# Patient Record
Sex: Male | Born: 1965 | Race: White | Hispanic: No | Marital: Married | Smoking: Current every day smoker
Health system: Southern US, Community
[De-identification: ages and names within clinical notes are randomized; demographics above are authoritative.]

## PROBLEM LIST (undated history)

## (undated) DIAGNOSIS — K859 Acute pancreatitis without necrosis or infection, unspecified: Secondary | ICD-10-CM

## (undated) DIAGNOSIS — I10 Essential (primary) hypertension: Secondary | ICD-10-CM

## (undated) HISTORY — PX: SHOULDER ARTHROSCOPY: SHX128

---

## 2002-03-15 ENCOUNTER — Encounter: Payer: Self-pay | Admitting: Emergency Medicine

## 2002-03-15 ENCOUNTER — Emergency Department (HOSPITAL_COMMUNITY): Admission: EM | Admit: 2002-03-15 | Discharge: 2002-03-15 | Payer: Self-pay | Admitting: Emergency Medicine

## 2005-09-02 ENCOUNTER — Encounter: Payer: Self-pay | Admitting: Orthopedic Surgery

## 2008-10-28 ENCOUNTER — Inpatient Hospital Stay (HOSPITAL_COMMUNITY): Admission: EM | Admit: 2008-10-28 | Discharge: 2008-10-30 | Payer: Self-pay | Admitting: Emergency Medicine

## 2008-10-28 ENCOUNTER — Encounter (INDEPENDENT_AMBULATORY_CARE_PROVIDER_SITE_OTHER): Payer: Self-pay | Admitting: Internal Medicine

## 2010-10-16 LAB — URINALYSIS, ROUTINE W REFLEX MICROSCOPIC
Nitrite: NEGATIVE
Protein, ur: >300 mg/dL — AB
Specific Gravity, Urine: 1.016 (ref 1.005–1.030)
Urobilinogen, UA: 1 mg/dL (ref 0.0–1.0)

## 2010-10-16 LAB — DIFFERENTIAL
Basophils Absolute: 0.1 10*3/uL (ref 0.0–0.1)
Basophils Absolute: 0.1 10*3/uL (ref 0.0–0.1)
Basophils Relative: 0 % (ref 0–1)
Basophils Relative: 0 % (ref 0–1)
Eosinophils Absolute: 0.1 10*3/uL (ref 0.0–0.7)
Eosinophils Absolute: 0.1 10*3/uL (ref 0.0–0.7)
Lymphocytes Relative: 32 % (ref 12–46)
Lymphocytes Relative: 54 % — ABNORMAL HIGH (ref 12–46)
Lymphs Abs: 4.5 10*3/uL — ABNORMAL HIGH (ref 0.7–4.0)
Monocytes Absolute: 1.1 10*3/uL — ABNORMAL HIGH (ref 0.1–1.0)
Monocytes Relative: 9 % (ref 3–12)
Neutro Abs: 8.7 10*3/uL — ABNORMAL HIGH (ref 1.7–7.7)
Neutrophils Relative %: 34 % — ABNORMAL LOW (ref 43–77)
Neutrophils Relative %: 51 % (ref 43–77)
Neutrophils Relative %: 60 % (ref 43–77)

## 2010-10-16 LAB — CBC
HCT: 47.3 % (ref 39.0–52.0)
Hemoglobin: 13.3 g/dL (ref 13.0–17.0)
Hemoglobin: 16.3 g/dL (ref 13.0–17.0)
MCHC: 34.7 g/dL (ref 30.0–36.0)
MCV: 88.9 fL (ref 78.0–100.0)
MCV: 89 fL (ref 78.0–100.0)
Platelets: 240 10*3/uL (ref 150–400)
Platelets: 310 10*3/uL (ref 150–400)
RBC: 4.31 MIL/uL (ref 4.22–5.81)
WBC: 14.5 10*3/uL — ABNORMAL HIGH (ref 4.0–10.5)
WBC: 8.3 10*3/uL (ref 4.0–10.5)

## 2010-10-16 LAB — URINE CULTURE
Colony Count: NO GROWTH
Culture: NO GROWTH
Special Requests: POSITIVE

## 2010-10-16 LAB — RAPID URINE DRUG SCREEN, HOSP PERFORMED
Amphetamines: POSITIVE — AB
Cocaine: NOT DETECTED
Opiates: NOT DETECTED
Tetrahydrocannabinol: POSITIVE — AB

## 2010-10-16 LAB — BASIC METABOLIC PANEL
BUN: 25 mg/dL — ABNORMAL HIGH (ref 6–23)
BUN: 46 mg/dL — ABNORMAL HIGH (ref 6–23)
CO2: 26 mEq/L (ref 19–32)
Calcium: 8.5 mg/dL (ref 8.4–10.5)
Calcium: 9.3 mg/dL (ref 8.4–10.5)
Creatinine, Ser: 1.89 mg/dL — ABNORMAL HIGH (ref 0.4–1.5)
GFR calc Af Amer: >60 mL/min (ref >60)
GFR calc non Af Amer: 11 mL/min — ABNORMAL LOW (ref >60)
GFR calc non Af Amer: 40 mL/min — ABNORMAL LOW (ref >60)
Glucose, Bld: 130 mg/dL — ABNORMAL HIGH (ref 70–99)
Potassium: 3.5 mEq/L (ref 3.5–5.1)
Sodium: 137 mEq/L (ref 135–145)
Sodium: 140 mEq/L (ref 135–145)

## 2010-10-16 LAB — URINE MICROSCOPIC-ADD ON

## 2010-10-16 LAB — COMPREHENSIVE METABOLIC PANEL
Albumin: 4.1 g/dL (ref 3.5–5.2)
Alkaline Phosphatase: 112 U/L (ref 39–117)
BUN: 56 mg/dL — ABNORMAL HIGH (ref 6–23)
CO2: 25 mEq/L (ref 19–32)
Chloride: 94 mEq/L — ABNORMAL LOW (ref 96–112)
Creatinine, Ser: 9.43 mg/dL — ABNORMAL HIGH (ref 0.4–1.5)
GFR calc non Af Amer: 6 mL/min — ABNORMAL LOW (ref >60)
Glucose, Bld: 108 mg/dL — ABNORMAL HIGH (ref 70–99)
Total Bilirubin: 0.7 mg/dL (ref 0.3–1.2)

## 2010-10-16 LAB — CK: Total CK: 234 U/L — ABNORMAL HIGH (ref 7–232)

## 2010-10-16 LAB — ACETAMINOPHEN LEVEL: Acetaminophen (Tylenol), Serum: <10 ug/mL — ABNORMAL LOW (ref 10–30)

## 2010-10-16 LAB — HIV ANTIBODY (ROUTINE TESTING W REFLEX): HIV: NONREACTIVE

## 2010-10-16 LAB — SALICYLATE LEVEL: Salicylate Lvl: <4 mg/dL (ref 2.8–20.0)

## 2010-11-19 NOTE — Discharge Summary (Signed)
Gabriel Case, Gabriel Case             ACCOUNT NO.:  [id number]   MEDICAL RECORD NO.:  [id number]          PATIENT TYPE:  INP   LOCATION:  1338                         FACILITY:  [facility]   PHYSICIAN:  Gabriel Scott, MD     DATE OF BIRTH:  12/16/1965   DATE OF ADMISSION:  10/28/2008  DATE OF DISCHARGE:  10/30/2008                               DISCHARGE SUMMARY   PRIMARY MEDICAL DOCTOR:  Gentry Fitz.   DISCHARGE DIAGNOSES:  1. Acute renal failure, improved.  2. Acute gastroenteritis, resolved.  3. Normocytic anemia.  4. Polysubstance abuse.  5. Tobacco abuse.  6. Hypertension.  7. Leukocytosis, resolved.   DISCHARGE MEDICATIONS:  Carvedilol 12.5 mg p.o. 2 times daily.   DISCONTINUED MEDICATIONS:  Lisinopril  Hydrochlorothiazide.   PROCEDURES:  1. CT of the abdomen without contrast.  Impression:  Tiny left renal      calculus.  Question colonic wall thickening or splenic flexure      versus under-distension artifact.  Limited exam showing no definite      additional acute upper abdominal abnormalities.  2. CT pelvis without contrast.  Impression:  No acute intrapelvic      abnormalities.  3. Chest x-ray on April 24.  Impression:  Minimal streaky right      basilar atelectasis with no infiltrates, edema, or effusions.   PERTINENT LABS:  Urine culture is no growth.  Basic metabolic panel: BUN  20, creatinine 1.54.  CBC with hemoglobin 11.7, hematocrit 34.2, white  blood cell count 8.3, platelets 240,000.  HIV antibody nonreactive.  GC  and chlamydia probe, negative.  Urine sodium 77.  Urine creatinine 90.3.  Serum Sodium at that time urine testing time was 137.  CK was 234.  Urinalysis had greater than 300 mg per dL protein, moderate leukocytes,  21-50 white blood cells per high power field, and many bacteria.  Salicylate level was less than 4.  Blood alcohol level less than 5.  Initial creatinine of 9.43.  Serum acetaminophen level less than 10.  Urine drug screen was positive for  amphetamines, benzodiazepines, and  tetrahydrocannabinol.   CONSULTATIONS:  None.   HOSPITAL COURSE AND PATIENT DISPOSITION:  Gabriel Case is a 45 year old  pleasant gentleman with a history of hypertension who, for 1 year  duration on above medications.  He presented with a 3 to 4 day history  of intractable nausea and vomiting, and 1 day history of diarrhea x1.  He denied abdominal pain.  There was no dysuria or rash.  He had no sick  contacts and had not eaten anything unusual.  His blood pressure in the  ED was 80/40.  He denied IV drug abuse.  He was found to be in acute  renal failure with creatinine of 9.43.  He was then admitted for further  evaluation and management.   PROBLEM LIST:  1. Acute renal failure.  Possibly from dehydration and acute tubular      necrosis versus acute interstitial nephritis.  The patient was      admitted to stepdown.  He was aggressively hydrated with IV fluids.  He was nonoliguric.  Quickly, his creatinine started improving.  He      became asymptomatic of his GI symptoms.  The patient was      transferred out to a regular medical bed.  His creatinine is much      improved now.  He is advised to hold his lisinopril      hydrochlorothiazide until he follows up with an M.D. with complete      renal panel in the next 3 to 5 days, and then further decision is      to be made.  He has also been advised to avoid NSAID medications.      He was empirically started on Cipro, but his urine cultures are      negative and, hence discontinued.  2. Acute gastroenteritis, which is probably viral, and there has been      an outbreak of gastroenteritis around.  This has resolved and the      patient is tolerating diet.  3. Normocytic anemia.  For outpatient evaluation as deemed necessary.  4. Polysubstance abuse.  Cessation counseling done.  5. Tobacco abuse.  Again, cessation counseling done, and the patient      declined a nicotine patch.  6.  Leukocytosis - resolved.   The patient does not have a primary medical doctor.  We will provide  contact details for a primary MD. I have advised to follow up with a  repeat renal panel in the next 3 to 5 days.   Time taken in coordinating this discharge is 32 minutes.      Gabriel Scott, MD  Electronically Signed     AH/MEDQ  D:  10/30/2008  T:  10/30/2008  Job:  [id number]   cc:   Gabriel Case, M.D.  Fax: [phone]

## 2010-11-19 NOTE — H&P (Signed)
Gabriel Case, Gabriel Case             ACCOUNT NO.:  [id number]   MEDICAL RECORD NO.:  [id number]          PATIENT TYPE:  INP   LOCATION:  1338                         FACILITY:  [facility]   PHYSICIAN:  Della Goo, M.D. DATE OF BIRTH:  1965/11/30   DATE OF ADMISSION:  10/28/2008  DATE OF DISCHARGE:                              HISTORY & PHYSICAL   PRIMARY CARE PHYSICIAN:  Unassigned.   CHIEF COMPLAINT:  Nausea and vomiting.   HISTORY OF PRESENT ILLNESS:  This is a 45 year old male who presents to  the emergency department with complaints of severe nausea and vomiting  over 3 days.  He reports having 27 episodes.  He states that he has had  weakness, fatigue and dizziness.  He denies having any hematemesis.  He  denies having any diarrhea and denies having constipation.  He does  report having a cough which has been productive of whitish mucous.  His  family called the ambulance secondary to the patient's continued  symptoms and worsening condition.  On their arrival he was found to have  a blood pressure of 80/40.  The patient was started on IV fluids in the  field.   PAST MEDICAL HISTORY:  Significant for hypertension.   MEDICATIONS:  Include lisinopril, hydrochlorothiazide, and carvedilol.   ALLERGIES:  To PENICILLIN which cause rash, hives and swelling.   PAST SURGICAL HISTORY:  History of a left shoulder surgery.   SOCIAL HISTORY:  The patient is married.  He is a smoker.  He smokes 1  pack of cigarettes daily.  He reports drinking occasional alcohol.  He  also reports using marijuana.   FAMILY HISTORY:  Positive for coronary artery disease in 2 maternal  uncles.  No history of hypertension, diabetes or cancer that he knows  of.   REVIEW OF SYSTEMS:  Pertinents are mentioned above.  All other organ  systems are negative.   PHYSICAL EXAMINATION:  CONSTITUTIONAL:  This is a 45 year old toxic-  appearing thin male in mild distress.  VITAL SIGNS:  Temperature 97.4, blood  pressure after fluids on arrival  were 117/74, heart rate 79, respirations 16, O2 sats were 100%.  HEENT:  Examination normocephalic, atraumatic.  SKIN:  Examination the patient has flushing/facial erythema.  Pupils are  equally round reactive to light.  Extraocular movements are intact.  Funduscopic benign.  There is no scleral icterus or conjunctival pallor.  Nares are patent bilaterally.  Oropharynx is clear.  NECK:  Supple full range of motion.  No thyromegaly, adenopathy, jugular  venous distention.  CARDIOVASCULAR:  Regular rate and rhythm.  No murmurs, gallops or rubs.  LUNGS:  Clear to auscultation bilaterally.  ABDOMEN:  Positive bowel sounds, soft, nontender, nondistended.  No  hepatosplenomegaly.  EXTREMITIES:  Without cyanosis, clubbing or edema.  NEUROLOGIC:  Generalized weakness.  The patient is alert and oriented  x3.  Cranial nerves are intact.  Motor and sensory function are also  intact.  There are no focal deficits on examination.   LABORATORY STUDIES:  White blood cell count 14.5, hemoglobin 16.3,  hematocrit 47.3, MCV 88.9, platelets 310,000, neutrophils 60%,  lymphocytes  32%.  Sodium 136, potassium 3.6, chloride 94, carbon dioxide  25, BUN 56, creatinine 9.43, glucose 108, albumin 4.1, AST 10.  Urinalysis reveals large hemoglobin, moderate leukocyte esterase, and  total protein greater than 300, total creatinine kinase 234.  Urine  microscopic reveals 21-50 white blood cells per high-power field, 7-10  red blood cells per high-power field and many bacteria.  Urine drug  screen positive for benzodiazepines, positive for amphetamines and  positive for marijuana.   ASSESSMENT:  49. A 45 year old male being admitted with acute renal failure.  2. Dehydration.  3. Viral gastroenteritis.  4. Urinary tract infection.  5. Polysubstance abuse.   PLAN:  The patient will be admitted to the step-down ICU area.  Fluid  resuscitation will continue.  The patient will be  placed on clear  liquids for now.  Antiemetics have been ordered as well.  The patient  will be placed on ciprofloxacin to cover his urinary tract infection.  A  urine C and S has also been ordered.  The patient's blood pressure  medications and diuretics and ACE inhibitor will be held for now.  DVT  and GI prophylaxis will be initiated.  Further workup will ensue and  further consultations will ensue pending results of the patient's  clinical course and results of his clinical studies.      Della Goo, M.D.  Electronically Signed     HJ/MEDQ  D:  10/29/2008  T:  10/29/2008  Job:  [id number]

## 2021-02-08 ENCOUNTER — Emergency Department (HOSPITAL_COMMUNITY): Payer: Self-pay

## 2021-02-08 ENCOUNTER — Other Ambulatory Visit: Payer: Self-pay

## 2021-02-08 ENCOUNTER — Emergency Department (HOSPITAL_COMMUNITY)
Admission: EM | Admit: 2021-02-08 | Discharge: 2021-02-09 | Disposition: A | Discharge status: Home / Self Care | Payer: Self-pay | Attending: Emergency Medicine | Admitting: Emergency Medicine

## 2021-02-08 DIAGNOSIS — I10 Essential (primary) hypertension: Secondary | ICD-10-CM | POA: Insufficient documentation

## 2021-02-08 DIAGNOSIS — Z5321 Procedure and treatment not carried out due to patient leaving prior to being seen by health care provider: Secondary | ICD-10-CM | POA: Insufficient documentation

## 2021-02-08 DIAGNOSIS — R1013 Epigastric pain: Secondary | ICD-10-CM | POA: Insufficient documentation

## 2021-02-08 LAB — CBC WITH DIFFERENTIAL/PLATELET
Abs Immature Granulocytes: 0.03 10*3/uL (ref 0.00–0.07)
Basophils Absolute: 0.1 10*3/uL (ref 0.0–0.1)
Basophils Relative: 1 %
Eosinophils Absolute: 0.3 10*3/uL (ref 0.0–0.5)
Eosinophils Relative: 3 %
HCT: 47.2 % (ref 39.0–52.0)
Hemoglobin: 15.8 g/dL (ref 13.0–17.0)
Immature Granulocytes: 0 %
Lymphocytes Relative: 34 %
Lymphs Abs: 3.5 10*3/uL (ref 0.7–4.0)
MCH: 30 pg (ref 26.0–34.0)
MCHC: 33.5 g/dL (ref 30.0–36.0)
MCV: 89.6 fL (ref 80.0–100.0)
Monocytes Absolute: 0.6 10*3/uL (ref 0.1–1.0)
Monocytes Relative: 6 %
Neutro Abs: 5.8 10*3/uL (ref 1.7–7.7)
Neutrophils Relative %: 56 %
Platelets: 325 10*3/uL (ref 150–400)
RBC: 5.27 MIL/uL (ref 4.22–5.81)
RDW: 13.4 % (ref 11.5–15.5)
WBC: 10.3 10*3/uL (ref 4.0–10.5)
nRBC: 0 % (ref 0.0–0.2)

## 2021-02-08 LAB — COMPREHENSIVE METABOLIC PANEL
ALT: 16 U/L (ref 0–44)
AST: 18 U/L (ref 15–41)
Albumin: 3.7 g/dL (ref 3.5–5.0)
Alkaline Phosphatase: 103 U/L (ref 38–126)
Anion gap: 6 (ref 5–15)
BUN: 29 mg/dL — ABNORMAL HIGH (ref 6–20)
CO2: 27 mmol/L (ref 22–32)
Calcium: 9.6 mg/dL (ref 8.9–10.3)
Chloride: 101 mmol/L (ref 98–111)
Creatinine, Ser: 1.25 mg/dL — ABNORMAL HIGH (ref 0.61–1.24)
GFR, Estimated: >60 mL/min (ref >60)
Glucose, Bld: 105 mg/dL — ABNORMAL HIGH (ref 70–99)
Potassium: 4.5 mmol/L (ref 3.5–5.1)
Sodium: 134 mmol/L — ABNORMAL LOW (ref 135–145)
Total Bilirubin: 0.4 mg/dL (ref 0.3–1.2)
Total Protein: 6.5 g/dL (ref 6.5–8.1)

## 2021-02-08 LAB — TROPONIN I (HIGH SENSITIVITY): Troponin I (High Sensitivity): 6 ng/L (ref <18)

## 2021-02-08 LAB — LIPASE, BLOOD: Lipase: 30 U/L (ref 11–51)

## 2021-02-08 MED ORDER — ACETAMINOPHEN 325 MG PO TABS
650.0000 mg | ORAL_TABLET | Freq: Once | ORAL | Status: AC
  Administered 2021-02-08: 650 mg via ORAL
  Filled 2021-02-08: qty 2

## 2021-02-08 NOTE — ED Provider Notes (Signed)
Emergency Medicine Provider Triage Evaluation Note  Gabriel Case , a 55 y.o. male  was evaluated in triage.  Pt complains of epigastric/chest pain.  Patient states that he has had this pain intermittently over the last 3 weeks.  Pain is worse today.  Patient states that epigastric pain will become worse and radiates throughout his chest and to his back.  Patient describes pain as sharp.  Patient endorses associated nausea and vomiting.  Patient states that he has vomited twice today.  No hematemesis or coffee-ground emesis.  Patient denies any associated diaphoresis or shortness of breath.  Patient reports previous history of hypertension however has not been on medication for multiple years.  Review of Systems  Positive: Epigastric pain, chest pain, nausea, vomiting Negative: Numbness, weakness, syncope, lightheadedness,   Physical Exam  BP (!) 191/108 (BP Location: Right Arm)   Pulse 66   Temp 97.8 F (36.6 C) (Oral)   Resp 20   SpO2 97%  Gen:   Awake, no distress   Resp:  Normal effort, lungs clear to auscultation bilaterally MSK:   Moves extremities without difficulty  Other:  +2 carotid and radial pulse bilaterally.  Abdomen soft, nondistended, nontender.  Medical Decision Making  Medically screening exam initiated at 8:21 PM.  Appropriate orders placed.  MATTY VANROEKEL was informed that the remainder of the evaluation will be completed by another provider, this initial triage assessment does not replace that evaluation, and the importance of remaining in the ED until their evaluation is complete.  The patient appears stable so that the remainder of the work up may be completed by another provider.      Haskel Schroeder, PA-C 02/08/21 2023    Charlynne Pander, MD 02/08/21 [phone]

## 2021-02-08 NOTE — ED Triage Notes (Signed)
Epigastric pain that radiates to chest area with some nausea and reports its been going on for weeks but got worse tonight.   Pt further states it happens every night when he tries to sleep. Denies any jaw, neck, arm pain and dizziness.    Been off BP meds. Hypertensive in triage.

## 2021-02-08 NOTE — ED Notes (Signed)
No answer for

## 2021-02-09 LAB — TROPONIN I (HIGH SENSITIVITY): Troponin I (High Sensitivity): 7 ng/L (ref <18)

## 2021-02-09 NOTE — ED Notes (Signed)
Patient called for room assignment x1 with no response 

## 2021-09-16 ENCOUNTER — Emergency Department (HOSPITAL_COMMUNITY): Payer: Self-pay

## 2021-09-16 ENCOUNTER — Other Ambulatory Visit: Payer: Self-pay

## 2021-09-16 ENCOUNTER — Emergency Department (HOSPITAL_COMMUNITY)
Admission: EM | Admit: 2021-09-16 | Discharge: 2021-09-16 | Discharge status: Against Medical Advice | Payer: Self-pay | Attending: Emergency Medicine | Admitting: Emergency Medicine

## 2021-09-16 ENCOUNTER — Encounter (HOSPITAL_COMMUNITY): Payer: Self-pay

## 2021-09-16 DIAGNOSIS — Z5329 Procedure and treatment not carried out because of patient's decision for other reasons: Secondary | ICD-10-CM | POA: Insufficient documentation

## 2021-09-16 DIAGNOSIS — R1010 Upper abdominal pain, unspecified: Secondary | ICD-10-CM

## 2021-09-16 DIAGNOSIS — R1013 Epigastric pain: Secondary | ICD-10-CM | POA: Insufficient documentation

## 2021-09-16 HISTORY — DX: Acute pancreatitis without necrosis or infection, unspecified: K85.90

## 2021-09-16 HISTORY — DX: Essential (primary) hypertension: I10

## 2021-09-16 LAB — COMPREHENSIVE METABOLIC PANEL
ALT: 18 U/L (ref 0–44)
AST: 18 U/L (ref 15–41)
Albumin: 4 g/dL (ref 3.5–5.0)
Alkaline Phosphatase: 114 U/L (ref 38–126)
Anion gap: 10 (ref 5–15)
BUN: 16 mg/dL (ref 6–20)
CO2: 27 mmol/L (ref 22–32)
Calcium: 9.2 mg/dL (ref 8.9–10.3)
Chloride: 98 mmol/L (ref 98–111)
Creatinine, Ser: 0.95 mg/dL (ref 0.61–1.24)
GFR, Estimated: >60 mL/min (ref >60)
Glucose, Bld: 123 mg/dL — ABNORMAL HIGH (ref 70–99)
Potassium: 3.9 mmol/L (ref 3.5–5.1)
Sodium: 135 mmol/L (ref 135–145)
Total Bilirubin: 0.3 mg/dL (ref 0.3–1.2)
Total Protein: 7.3 g/dL (ref 6.5–8.1)

## 2021-09-16 LAB — CBC WITH DIFFERENTIAL/PLATELET
Abs Immature Granulocytes: 0.04 10*3/uL (ref 0.00–0.07)
Basophils Absolute: 0.1 10*3/uL (ref 0.0–0.1)
Basophils Relative: 1 %
Eosinophils Absolute: 0.1 10*3/uL (ref 0.0–0.5)
Eosinophils Relative: 1 %
HCT: 51.9 % (ref 39.0–52.0)
Hemoglobin: 17.5 g/dL — ABNORMAL HIGH (ref 13.0–17.0)
Immature Granulocytes: 0 %
Lymphocytes Relative: 29 %
Lymphs Abs: 3.1 10*3/uL (ref 0.7–4.0)
MCH: 30.6 pg (ref 26.0–34.0)
MCHC: 33.7 g/dL (ref 30.0–36.0)
MCV: 90.7 fL (ref 80.0–100.0)
Monocytes Absolute: 0.7 10*3/uL (ref 0.1–1.0)
Monocytes Relative: 7 %
Neutro Abs: 6.6 10*3/uL (ref 1.7–7.7)
Neutrophils Relative %: 62 %
Platelets: 301 10*3/uL (ref 150–400)
RBC: 5.72 MIL/uL (ref 4.22–5.81)
RDW: 13.5 % (ref 11.5–15.5)
WBC: 10.7 10*3/uL — ABNORMAL HIGH (ref 4.0–10.5)
nRBC: 0 % (ref 0.0–0.2)

## 2021-09-16 LAB — TROPONIN I (HIGH SENSITIVITY)
Troponin I (High Sensitivity): 6 ng/L (ref <18)
Troponin I (High Sensitivity): 6 ng/L (ref <18)

## 2021-09-16 LAB — I-STAT CHEM 8, ED
BUN: 17 mg/dL (ref 6–20)
Calcium, Ion: 1.22 mmol/L (ref 1.15–1.40)
Chloride: 99 mmol/L (ref 98–111)
Creatinine, Ser: 1 mg/dL (ref 0.61–1.24)
Glucose, Bld: 116 mg/dL — ABNORMAL HIGH (ref 70–99)
HCT: 54 % — ABNORMAL HIGH (ref 39.0–52.0)
Hemoglobin: 18.4 g/dL — ABNORMAL HIGH (ref 13.0–17.0)
Potassium: 4.3 mmol/L (ref 3.5–5.1)
Sodium: 138 mmol/L (ref 135–145)
TCO2: 30 mmol/L (ref 22–32)

## 2021-09-16 LAB — LIPASE, BLOOD: Lipase: 25 U/L (ref 11–51)

## 2021-09-16 MED ORDER — ONDANSETRON HCL 4 MG/2ML IJ SOLN
4.0000 mg | Freq: Once | INTRAMUSCULAR | Status: AC
  Administered 2021-09-16: 4 mg via INTRAVENOUS
  Filled 2021-09-16: qty 2

## 2021-09-16 MED ORDER — HYDROMORPHONE HCL 1 MG/ML IJ SOLN
0.5000 mg | Freq: Once | INTRAMUSCULAR | Status: AC
  Administered 2021-09-16: 0.5 mg via INTRAVENOUS
  Filled 2021-09-16: qty 1

## 2021-09-16 MED ORDER — IOHEXOL 350 MG/ML SOLN
100.0000 mL | Freq: Once | INTRAVENOUS | Status: AC | PRN
  Administered 2021-09-16: 80 mL via INTRAVENOUS

## 2021-09-16 NOTE — ED Triage Notes (Signed)
Patient reports upper abdominal pain that started several days. States that he has history of pancreatitis and pain is same. Reports nausea/vomiting.  ?

## 2021-09-16 NOTE — ED Notes (Signed)
Pt in CT.

## 2021-09-16 NOTE — ED Provider Notes (Signed)
[facility] EMERGENCY DEPARTMENT Provider Note   CSN: [id number] Arrival date & time: 09/16/21  4098     History  Chief Complaint  Patient presents with   Abdominal Pain    Gabriel Case is a 56 y.o. male.  Patient complains of epigastric abdominal pain..  No known past medical history  The history is provided by the patient and medical records. No language interpreter was used.  Abdominal Pain Pain location:  Epigastric Pain quality: aching   Pain radiates to:  Does not radiate Pain severity:  Moderate Onset quality:  Sudden Timing:  Constant Progression:  Worsening Chronicity:  New Context: not awakening from sleep   Relieved by:  Nothing Associated symptoms: no chest pain, no cough, no diarrhea, no fatigue and no hematuria       Home Medications Prior to Admission medications   Not on File      Allergies    Penicillins    Review of Systems   Review of Systems  Constitutional:  Negative for appetite change and fatigue.  HENT:  Negative for congestion, ear discharge and sinus pressure.   Eyes:  Negative for discharge.  Respiratory:  Negative for cough.   Cardiovascular:  Negative for chest pain.  Gastrointestinal:  Positive for abdominal pain. Negative for diarrhea.  Genitourinary:  Negative for frequency and hematuria.  Musculoskeletal:  Negative for back pain.  Skin:  Negative for rash.  Neurological:  Negative for seizures and headaches.  Psychiatric/Behavioral:  Negative for hallucinations.    Physical Exam Updated Vital Signs BP (!) 179/98   Pulse 69   Temp 97.8 F (36.6 C) (Oral)   Resp 19   Ht 5\' 7"  (1.702 m)   Wt 63.5 kg   SpO2 95%   BMI 21.93 kg/m  Physical Exam Vitals and nursing note reviewed.  Constitutional:      Appearance: He is well-developed.  HENT:     Head: Normocephalic.     Mouth/Throat:     Mouth: Mucous membranes are moist.  Eyes:     General: No scleral icterus.    Conjunctiva/sclera: Conjunctivae normal.   Neck:     Thyroid: No thyromegaly.  Cardiovascular:     Rate and Rhythm: Normal rate and regular rhythm.     Heart sounds: No murmur heard.   No friction rub. No gallop.  Pulmonary:     Breath sounds: No stridor. No wheezing or rales.  Chest:     Chest wall: No tenderness.  Abdominal:     General: There is no distension.     Tenderness: There is abdominal tenderness. There is no rebound.  Musculoskeletal:        General: Normal range of motion.     Cervical back: Neck supple.  Lymphadenopathy:     Cervical: No cervical adenopathy.  Skin:    Findings: No erythema or rash.  Neurological:     Mental Status: He is alert and oriented to person, place, and time.     Motor: No abnormal muscle tone.     Coordination: Coordination normal.  Psychiatric:        Behavior: Behavior normal.    ED Results / Procedures / Treatments   Labs (all labs ordered are listed, but only abnormal results are displayed) Labs Reviewed  CBC WITH DIFFERENTIAL/PLATELET - Abnormal; Notable for the following components:      Result Value   WBC 10.7 (*)    Hemoglobin 17.5 (*)    All other components within  normal limits  COMPREHENSIVE METABOLIC PANEL - Abnormal; Notable for the following components:   Glucose, Bld 123 (*)    All other components within normal limits  I-STAT CHEM 8, ED - Abnormal; Notable for the following components:   Glucose, Bld 116 (*)    Hemoglobin 18.4 (*)    HCT 54.0 (*)    All other components within normal limits  LIPASE, BLOOD  TROPONIN I (HIGH SENSITIVITY)  TROPONIN I (HIGH SENSITIVITY)    EKG None  Radiology DG Chest Port 1 View  Result Date: 09/16/2021 CLINICAL DATA:  A 56 year old male presents for evaluation of shortness of breath and upper abdominal pain for several days. EXAM: PORTABLE CHEST 1 VIEW COMPARISON:  February 08, 2021. FINDINGS: EKG leads project over the chest. Cardiomediastinal contours and hilar structures are normal. Lungs are clear. No visible  pneumothorax. On limited assessment there is no acute skeletal process. IMPRESSION: No acute cardiopulmonary disease. Electronically Signed   By: Donzetta Kohut M.D.   On: 09/16/2021 10:06   CT Angio Chest/Abd/Pel for Dissection W and/or Wo Contrast  Result Date: 09/16/2021 CLINICAL DATA:  Upper abdominal pain for several days. History of pancreatitis. "Acute aortic syndrome suspected". EXAM: CT ANGIOGRAPHY CHEST, ABDOMEN AND PELVIS TECHNIQUE: Non-contrast CT of the chest was initially obtained. Multidetector CT imaging through the chest, abdomen and pelvis was performed using the standard protocol during bolus administration of intravenous contrast. Multiplanar reconstructed images and MIPs were obtained and reviewed to evaluate the vascular anatomy. RADIATION DOSE REDUCTION: This exam was performed according to the departmental dose-optimization program which includes automated exposure control, adjustment of the mA and/or kV according to patient size and/or use of iterative reconstruction technique. CONTRAST:  80mL OMNIPAQUE IOHEXOL 350 MG/ML SOLN COMPARISON:  Chest radiograph of earlier today. Abdominopelvic CT 10/28/2008. FINDINGS: CTA CHEST FINDINGS Cardiovascular: No intramural hematoma on noncontrast imaging. Aortic atherosclerosis. No dissection. Normal heart size, without pericardial effusion. Lad and left circumflex coronary artery calcification. No central pulmonary embolism, on this non-dedicated study. Mediastinum/Nodes: No mediastinal or hilar adenopathy. Lungs/Pleura: No pleural fluid. Moderate centrilobular emphysema. Secretions in the right mainstem bronchus. Anterior right upper lobe scarring with mild bronchiectasis. Musculoskeletal: No acute osseous abnormality. Review of the MIP images confirms the above findings. CTA ABDOMEN AND PELVIS FINDINGS Hepatobiliary: Normal liver. Normal gallbladder, without biliary ductal dilatation. Pancreas: Normal, without mass or ductal dilatation. Spleen:  Normal in size, without focal abnormality. Adrenals/Urinary Tract: Normal adrenal glands. Interpolar left renal too small to characterize lesion. Normal right kidney. No hydronephrosis. Normal urinary bladder. Stomach/Bowel: The proximal stomach is underdistended. Apparent wall thickening including at 19 mm on 105/5 is most likely secondary. Colonic stool burden suggests constipation. Normal terminal ileum and appendix. Normal small bowel. Vascular/Lymphatic: Aortic atherosclerosis. No aneurysm or dissection. Celiac and SMA both widely patent. Single renal arteries without significant stenosis. The IMA is diminutive, but patent. No aneurysm or significant stenosis involving the iliac vasculature. No abdominopelvic adenopathy. Reproductive: Normal prostate. Other: No significant free fluid.  No free intraperitoneal air. 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 Disc bulges at L3-4 and L4-5. Review of the MIP images confirms the above findings. IMPRESSION: 1. No evidence of aortic aneurysm or dissection. 2. No acute process within the chest, abdomen, or pelvis. 3. Age advanced coronary artery atherosclerosis. Recommend assessment of coronary risk factors and consideration of medical therapy. 4. Aortic atherosclerosis (ICD10-I70.0) and emphysema (ICD10-J43.9). 5.  Possible constipation. Electronically Signed   By: Jeronimo Greaves M.D.   On: 09/16/2021 11:19  Procedures Procedures    Medications Ordered in ED Medications  HYDROmorphone (DILAUDID) injection 0.5 mg (0.5 mg Intravenous Given 09/16/21 1109)  ondansetron (ZOFRAN) injection 4 mg (4 mg Intravenous Given 09/16/21 1109)  iohexol (OMNIPAQUE) 350 MG/ML injection 100 mL (80 mLs Intravenous Contrast Given 09/16/21 1103)    ED Course/ Medical Decision Making/ A&P  CRITICAL CARE Performed by: Bethann Berkshire Total critical care time:35 minutes Critical care time was exclusive of separately billable procedures and treating other patients. Critical  care was necessary to treat or prevent imminent or life-threatening deterioration. Critical care was time spent personally by me on the following activities: development of treatment plan with patient and/or surrogate as well as nursing, discussions with consultants, evaluation of patient's response to treatment, examination of patient, obtaining history from patient or surrogate, ordering and performing treatments and interventions, ordering and review of laboratory studies, ordering and review of radiographic studies, pulse oximetry and re-evaluation of patient's condition.                          Medical Decision Making Amount and/or Complexity of Data Reviewed Labs: ordered. Radiology: ordered. ECG/medicine tests: ordered.  Risk Prescription drug management.  This patient presents to the ED for concern of abdominal pain, this involves an extensive number of treatment options, and is a complaint that carries with it a high risk of complications and morbidity.  The differential diagnosis includes gastritis, gastric ulcer   Co morbidities that complicate the patient evaluation  None   Additional history obtained:  Additional history obtained from patient External records from outside source obtained and reviewed including hospital record   Lab Tests:  I Ordered, and personally interpreted labs.  The pertinent results include: CBC and chemistries which were unremarkable   Imaging Studies ordered:  I ordered imaging studies including CT angio of the abdomen and pelvis I independently visualized and interpreted imaging which showed probable coronary artery disease I agree with the radiologist interpretation   Cardiac Monitoring:  The patient was maintained on a cardiac monitor.  I personally viewed and interpreted the cardiac monitored which showed an underlying rhythm of: Normal sinus rhythm   Medicines ordered and prescription drug management:  I ordered medication  including Dilaudid and Zofran for pain Reevaluation of the patient after these medicines showed that the patient improved I have reviewed the patients home medicines and have made adjustments as needed   Test Considered:  None   Critical Interventions:  None   Consultations Obtained: No consult Problem List / ED Course:  Abdominal pain and possible coronary artery disease    Social Determinants of Health:  None        Patient with abdominal pain.  Patient left AMA before I was able to discuss the results and plan with him        Final Clinical Impression(s) / ED Diagnoses Final diagnoses:  None    Rx / DC Orders ED Discharge Orders     None         Bethann Berkshire, MD 09/20/21 1123

## 2021-09-16 NOTE — ED Notes (Signed)
In room to check on patient after MD inquired about patient whereabouts. Noted with gown on bed, IV in sink and blood in trash can.  ?

## 2021-09-16 NOTE — ED Notes (Signed)
Pt verbalized he has high blood pressure but doesn't go to the Dr.  ?

## 2022-08-04 ENCOUNTER — Emergency Department (HOSPITAL_COMMUNITY)
Admission: EM | Admit: 2022-08-04 | Discharge: 2022-08-04 | Disposition: A | Discharge status: Home / Self Care | Payer: 59 | Attending: Emergency Medicine | Admitting: Emergency Medicine

## 2022-08-04 ENCOUNTER — Encounter (HOSPITAL_COMMUNITY): Payer: Self-pay | Admitting: Emergency Medicine

## 2022-08-04 ENCOUNTER — Other Ambulatory Visit: Payer: Self-pay

## 2022-08-04 ENCOUNTER — Emergency Department (HOSPITAL_COMMUNITY): Payer: 59

## 2022-08-04 DIAGNOSIS — N281 Cyst of kidney, acquired: Secondary | ICD-10-CM | POA: Diagnosis not present

## 2022-08-04 DIAGNOSIS — R1012 Left upper quadrant pain: Secondary | ICD-10-CM | POA: Diagnosis not present

## 2022-08-04 DIAGNOSIS — K2900 Acute gastritis without bleeding: Secondary | ICD-10-CM | POA: Diagnosis not present

## 2022-08-04 DIAGNOSIS — K6389 Other specified diseases of intestine: Secondary | ICD-10-CM | POA: Diagnosis not present

## 2022-08-04 DIAGNOSIS — I10 Essential (primary) hypertension: Secondary | ICD-10-CM

## 2022-08-04 LAB — CBC
HCT: 50.2 % (ref 39.0–52.0)
Hemoglobin: 16.9 g/dL (ref 13.0–17.0)
MCH: 28.9 pg (ref 26.0–34.0)
MCHC: 33.7 g/dL (ref 30.0–36.0)
MCV: 85.8 fL (ref 80.0–100.0)
Platelets: 358 10*3/uL (ref 150–400)
RBC: 5.85 MIL/uL — ABNORMAL HIGH (ref 4.22–5.81)
RDW: 14.6 % (ref 11.5–15.5)
WBC: 10.5 10*3/uL (ref 4.0–10.5)
nRBC: 0 % (ref 0.0–0.2)

## 2022-08-04 LAB — COMPREHENSIVE METABOLIC PANEL
ALT: 23 U/L (ref 0–44)
AST: 19 U/L (ref 15–41)
Albumin: 4 g/dL (ref 3.5–5.0)
Alkaline Phosphatase: 112 U/L (ref 38–126)
Anion gap: 11 (ref 5–15)
BUN: 36 mg/dL — ABNORMAL HIGH (ref 6–20)
CO2: 23 mmol/L (ref 22–32)
Calcium: 9.8 mg/dL (ref 8.9–10.3)
Chloride: 101 mmol/L (ref 98–111)
Creatinine, Ser: 0.78 mg/dL (ref 0.61–1.24)
GFR, Estimated: >60 mL/min (ref >60)
Glucose, Bld: 105 mg/dL — ABNORMAL HIGH (ref 70–99)
Potassium: 4.2 mmol/L (ref 3.5–5.1)
Sodium: 135 mmol/L (ref 135–145)
Total Bilirubin: 0.6 mg/dL (ref 0.3–1.2)
Total Protein: 7.3 g/dL (ref 6.5–8.1)

## 2022-08-04 LAB — URINALYSIS, ROUTINE W REFLEX MICROSCOPIC
Bacteria, UA: NONE SEEN
Bilirubin Urine: NEGATIVE
Glucose, UA: NEGATIVE mg/dL
Ketones, ur: NEGATIVE mg/dL
Leukocytes,Ua: NEGATIVE
Nitrite: NEGATIVE
Protein, ur: 30 mg/dL — AB
Specific Gravity, Urine: >1.046 — ABNORMAL HIGH (ref 1.005–1.030)
pH: 5 (ref 5.0–8.0)

## 2022-08-04 LAB — TROPONIN I (HIGH SENSITIVITY): Troponin I (High Sensitivity): 13 ng/L (ref <18)

## 2022-08-04 LAB — LIPASE, BLOOD: Lipase: 40 U/L (ref 11–51)

## 2022-08-04 MED ORDER — IOHEXOL 300 MG/ML  SOLN
100.0000 mL | Freq: Once | INTRAMUSCULAR | Status: AC | PRN
  Administered 2022-08-04: 100 mL via INTRAVENOUS

## 2022-08-04 MED ORDER — ONDANSETRON HCL 4 MG/2ML IJ SOLN
4.0000 mg | Freq: Once | INTRAMUSCULAR | Status: AC
  Administered 2022-08-04: 4 mg via INTRAVENOUS
  Filled 2022-08-04: qty 2

## 2022-08-04 MED ORDER — ALUMINUM-MAGNESIUM-SIMETHICONE 200-200-20 MG/5ML PO SUSP: 30.0000 mL | Freq: Three times a day (TID) | ORAL | 0 refills | Status: AC | PRN

## 2022-08-04 MED ORDER — OMEPRAZOLE 20 MG PO CPDR: 20.0000 mg | DELAYED_RELEASE_CAPSULE | Freq: Every day | ORAL | 0 refills | Status: AC

## 2022-08-04 MED ORDER — SODIUM CHLORIDE 0.9 % IV BOLUS
500.0000 mL | Freq: Once | INTRAVENOUS | Status: AC
  Administered 2022-08-04: 500 mL via INTRAVENOUS

## 2022-08-04 MED ORDER — ALUM & MAG HYDROXIDE-SIMETH 200-200-20 MG/5ML PO SUSP
30.0000 mL | Freq: Once | ORAL | Status: AC
  Administered 2022-08-04: 30 mL via ORAL
  Filled 2022-08-04: qty 30

## 2022-08-04 MED ORDER — MORPHINE SULFATE (PF) 4 MG/ML IV SOLN
4.0000 mg | Freq: Once | INTRAVENOUS | Status: DC
  Filled 2022-08-04: qty 1

## 2022-08-04 NOTE — Discharge Instructions (Addendum)
Take the medications prescribed as discussed.  Plan to follow-up with Dr. Abbey Chatters for further evaluation of your symptoms.  As discussed, your blood pressure was significantly elevated today, and may be secondary to your abdominal pain, however you do need close follow-up for this as well.  I am referring you to primary medical MDs surgical to establish care with a primary doctor.

## 2022-08-04 NOTE — ED Provider Triage Note (Signed)
Emergency Medicine Provider Triage Evaluation Note  Gabriel Case , a 57 y.o. male  was evaluated in triage.  Pt complains of upper abdominal pain radiating into his chest and back.  Reports prior history of similar symptoms and was told he has pancreatitis.  He does not have a history of alcohol abuse.  Nausea or vomiting, no shortness of breath, Neri complaints, no diarrhea.  Review of Systems  Positive: Abdominal pain Negative: Nausea or vomiting.  Physical Exam  BP (!) 184/105   Pulse 94   Temp 97.9 F (36.6 C) (Oral)   Resp 18   Ht 5\' 7"  (1.702 m)   Wt 59 kg   SpO2 96%   BMI 20.36 kg/m  Gen:   Awake, no distress   Resp:  Normal effort  MSK:   Moves extremities without difficulty  Other:    Medical Decision Making  Medically screening exam initiated at 3:01 PM.  Appropriate orders placed.  Gabriel Case was informed that the remainder of the evaluation will be completed by another provider, this initial triage assessment does not replace that evaluation, and the importance of remaining in the ED until their evaluation is complete.  Patient presenting with upper abdominal pain, history of pancreatitis, his labs back so far are negative including a normal lipase and normal LFTs.  CT imaging has been ordered, awaiting room placement for full exam.   Evalee Jefferson, PA-C 08/04/22 1503

## 2022-08-04 NOTE — ED Notes (Signed)
Vo to hold morphine at this time, wasted with Musician

## 2022-08-04 NOTE — ED Provider Notes (Signed)
[facility] Provider Note   CSN: [id number] Arrival date & time: 08/04/22  1011     History {Add pertinent medical, surgical, social history, OB history to HPI:1} Chief Complaint  Patient presents with   Abdominal Pain    Gabriel Case is a 57 y.o. male   The history is provided by the patient.       Home Medications Prior to Admission medications   Not on File      Allergies    Penicillins    Review of Systems   Review of Systems  Physical Exam Updated Vital Signs BP (!) 185/117 (BP Location: Right Arm)   Pulse 72   Temp 97.9 F (36.6 C) (Oral)   Resp 19   Ht 5\' 7"  (1.702 m)   Wt 59 kg   SpO2 98%   BMI 20.36 kg/m  Physical Exam  ED Results / Procedures / Treatments   Labs (all labs ordered are listed, but only abnormal results are displayed) Labs Reviewed  COMPREHENSIVE METABOLIC PANEL - Abnormal; Notable for the following components:      Result Value   Glucose, Bld 105 (*)    BUN 36 (*)    All other components within normal limits  CBC - Abnormal; Notable for the following components:   RBC 5.85 (*)    All other components within normal limits  LIPASE, BLOOD  URINALYSIS, ROUTINE W REFLEX MICROSCOPIC  TROPONIN I (HIGH SENSITIVITY)    EKG None  Radiology CT ABDOMEN PELVIS W CONTRAST  Result Date: 08/04/2022 CLINICAL DATA:  Intermittent abdomen pain for 1 year. History of pancreatitis. EXAM: CT ABDOMEN AND PELVIS WITH CONTRAST TECHNIQUE: Multidetector CT imaging of the abdomen and pelvis was performed using the standard protocol following bolus administration of intravenous contrast. RADIATION DOSE REDUCTION: This exam was performed according to the departmental dose-optimization program which includes automated exposure control, adjustment of the mA and/or kV according to patient size and/or use of iterative reconstruction technique. CONTRAST:  124mL OMNIPAQUE IOHEXOL 300 MG/ML  SOLN COMPARISON:   September 16, 2021. FINDINGS: Lower chest: No acute abnormality. Hepatobiliary: No focal liver abnormality is seen. No gallstones, gallbladder wall thickening, or biliary dilatation. Pancreas: Unremarkable. No pancreatic ductal dilatation or surrounding inflammatory changes. Spleen: Normal in size without focal abnormality. Adrenals/Urinary Tract: Bilateral adrenal glands are normal. No hydronephrosis is noted bilaterally. There is a 6 mm simple cyst in the midpole left kidney. No follow-up is recommended. The right kidney is normal. The bladder is normal. Stomach/Bowel: Mild dilated thickened wall small bowel loops are identified in the upper and mid abdomen. The distal small bowel loops are normal. The sigmoid colon is collapsed with question thickened bowel wall. The remainder of the colon is normal. The appendix is not seen but no inflammation is noted around cecum. Bowel wall thickening of the stomach noted. Vascular/Lymphatic: Aortic atherosclerosis. No enlarged abdominal or pelvic lymph nodes. Reproductive: Prostate calcifications are noted. Other: None. Musculoskeletal: Degenerative joint changes of the spine are noted. IMPRESSION: 1. Mild dilated thickened wall small bowel loops in the upper and mid abdomen. This is nonspecific but can be seen in enteritis. 2. The sigmoid colon is collapsed with question thickened bowel wall. This is nonspecific but can be seen in colitis. 3. Bowel wall thickening of the stomach. This is nonspecific but can be seen in gastritis. 4. Aortic atherosclerosis. Aortic Atherosclerosis (ICD10-I70.0). Electronically Signed   By: Abelardo Diesel M.D.   On: 08/04/2022  15:11    Procedures Procedures  {Document cardiac monitor, telemetry assessment procedure when appropriate:1}  Medications Ordered in ED Medications  iohexol (OMNIPAQUE) 300 MG/ML solution 100 mL (100 mLs Intravenous Contrast Given 08/04/22 1503)    ED Course/ Medical Decision Making/ A&P   {   Click here for  ABCD2, HEART and other calculatorsREFRESH Note before signing :1}                          Medical Decision Making Amount and/or Complexity of Data Reviewed Labs: ordered. Radiology: ordered.  Risk Prescription drug management.     {Document critical care time when appropriate:1} {Document review of labs and clinical decision tools ie heart score, Chads2Vasc2 etc:1}  {Document your independent review of radiology images, and any outside records:1} {Document your discussion with family members, caretakers, and with consultants:1} {Document social determinants of health affecting pt's care:1} {Document your decision making why or why not admission, treatments were needed:1} Final Clinical Impression(s) / ED Diagnoses Final diagnoses:  None    Rx / DC Orders ED Discharge Orders     None

## 2022-08-04 NOTE — ED Triage Notes (Signed)
Pt c/o upper left abd pain with n/v x 2 weeks, pt reports s/s recurrent over the past 1-2 years, pt reports pain radiates into his chest and back

## 2022-08-04 NOTE — ED Notes (Signed)
Pt is fine to dc per J Idol PA. Pt strongly encouraged to fu with pcp or even health dpt for bp and to fu with GI.

## 2023-06-27 IMAGING — CT CT ANGIO CHEST-ABD-PELV FOR DISSECTION W/ AND WO/W CM
2 of 7 series · 14 of 46 positions shown, 16 images · IV contrast (Omnipaque or Isovue)
Comparison: Chest radiograph of earlier today. Abdominopelvic CT
10/28/2008.

CLINICAL DATA: Upper abdominal pain for several days. History of
pancreatitis. "Acute aortic syndrome suspected".

EXAM:
CT ANGIOGRAPHY CHEST, ABDOMEN AND PELVIS
TECHNIQUE: Non-contrast CT of the chest was initially obtained.

[Series 5: axial arterial · axial · arterial · 0.81mm/px · z∈[+995,+1565]mm · 11 of 220 slices shown, 13 images]
[im 15/220  soft-tissue]
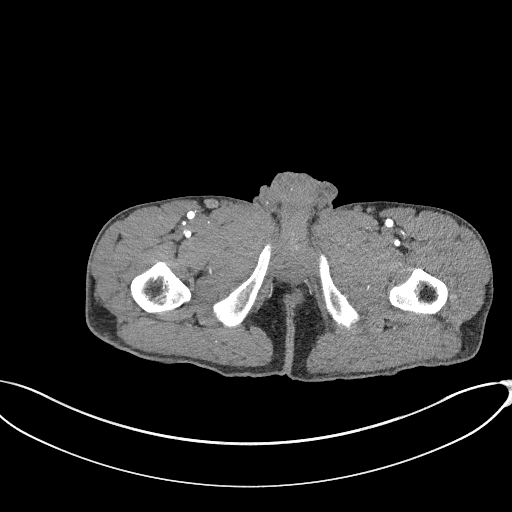
[im 15/220  bone]
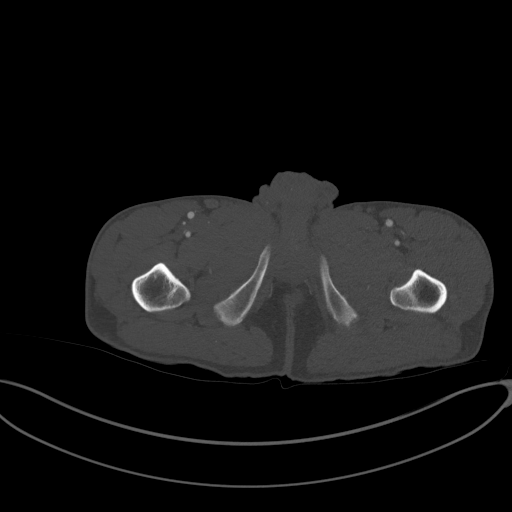
[im 30/220  soft-tissue]
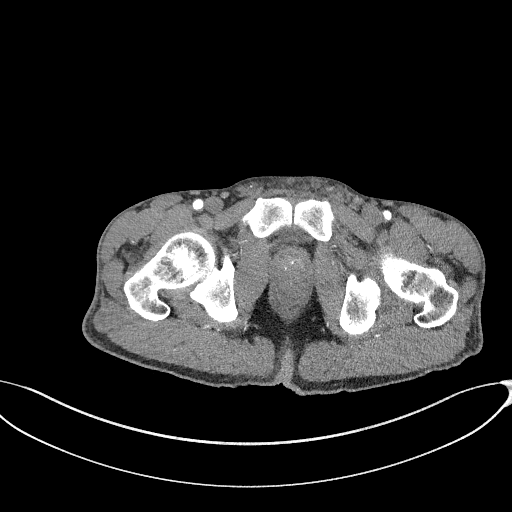
[im 59/220  soft-tissue]
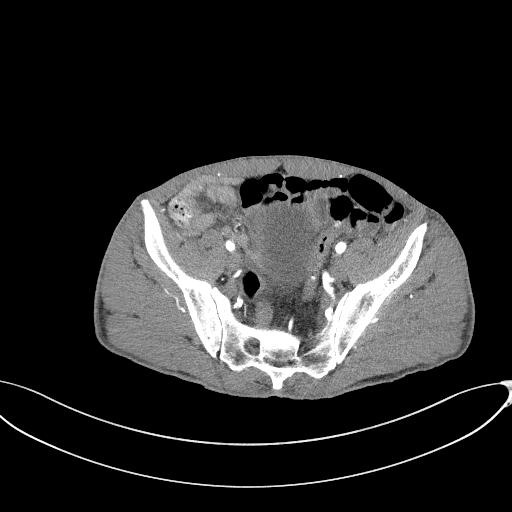
[im 74/220  soft-tissue]
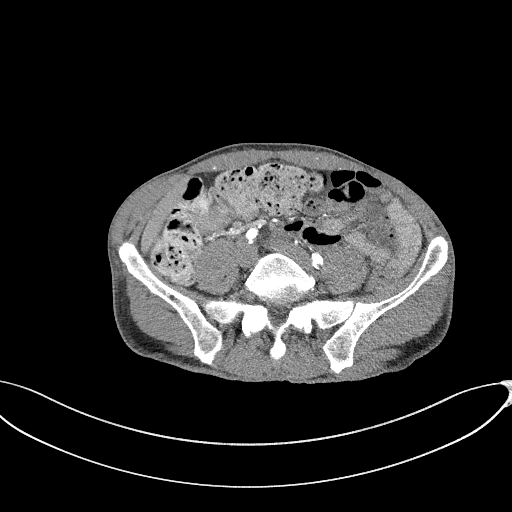
[im 88/220  soft-tissue]
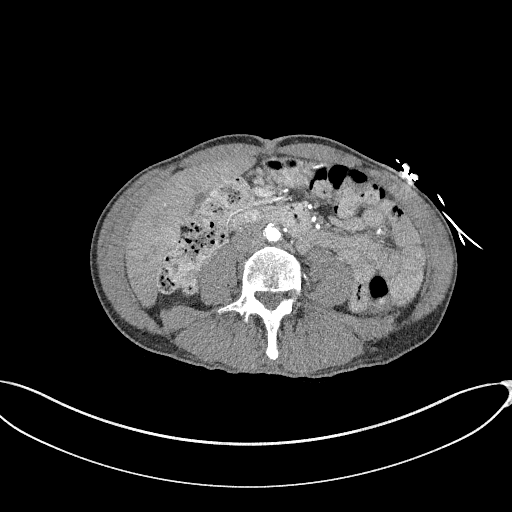
[im 117/220  soft-tissue]
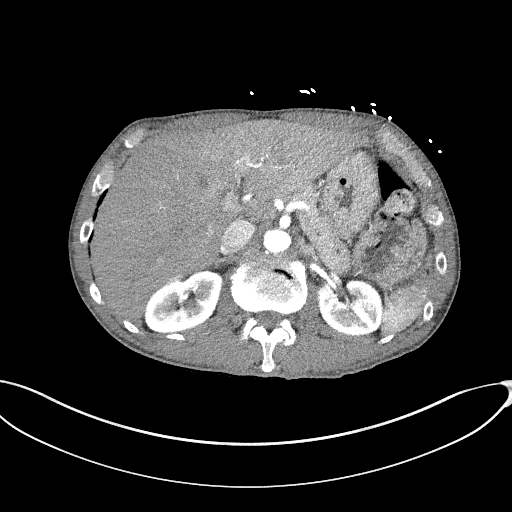
[im 132/220  soft-tissue]
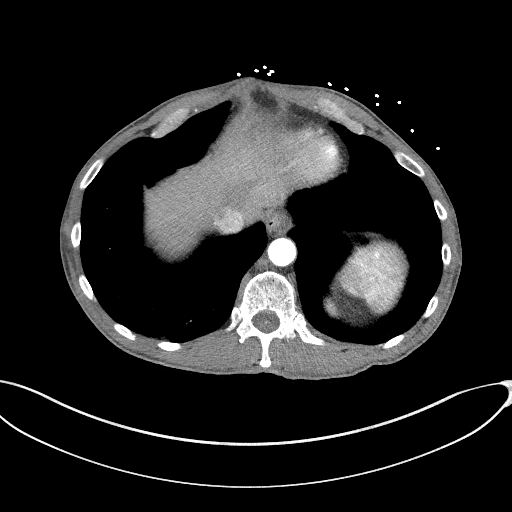
[im 147/220  soft-tissue]
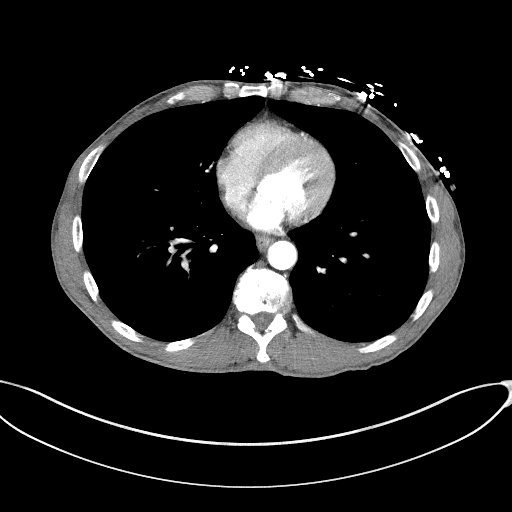
[im 161/220  soft-tissue]
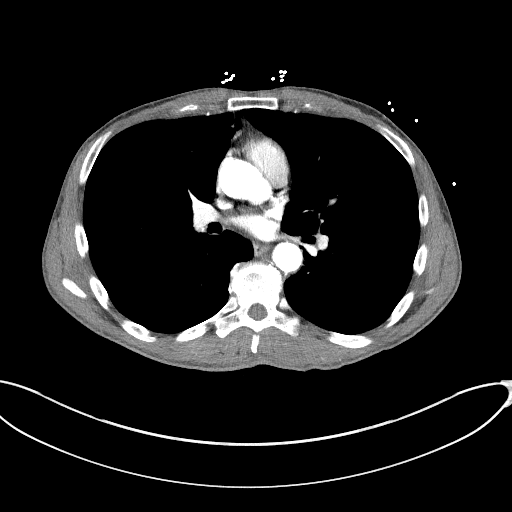
[im 161/220  bone]
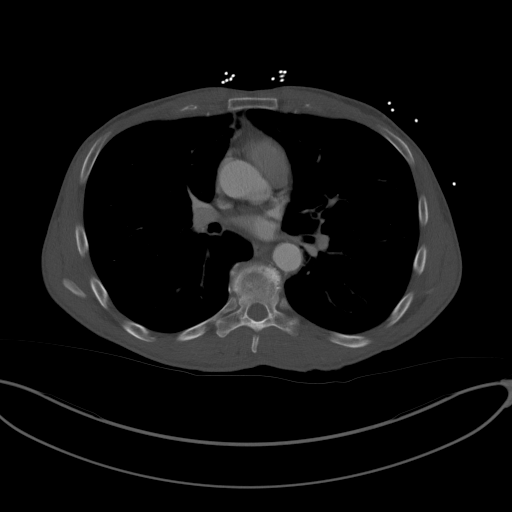
[im 190/220  soft-tissue]
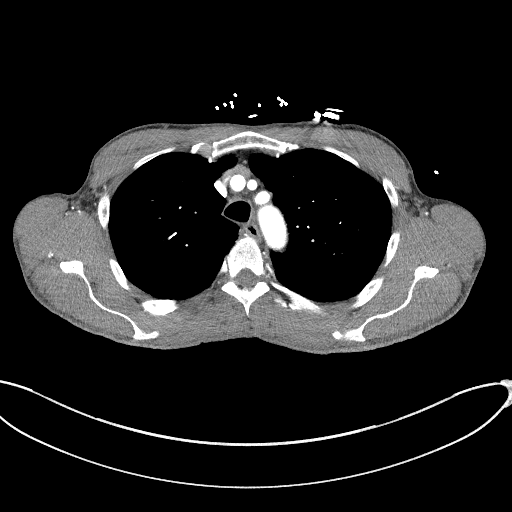
[im 205/220  soft-tissue]
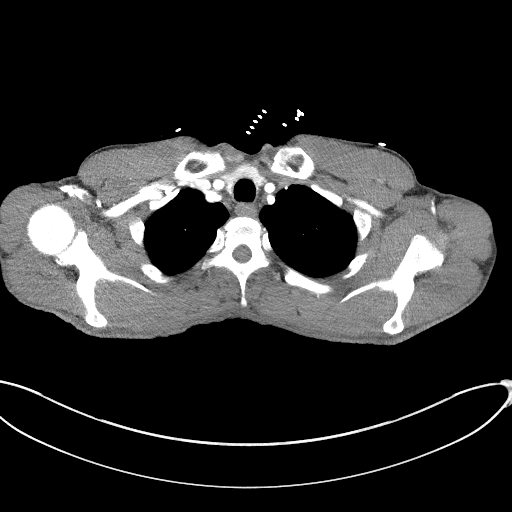

[Series 9: cor soft · coronal · 0.76mm/px · 3 of 148 slices shown]
[im 37/148  soft-tissue]
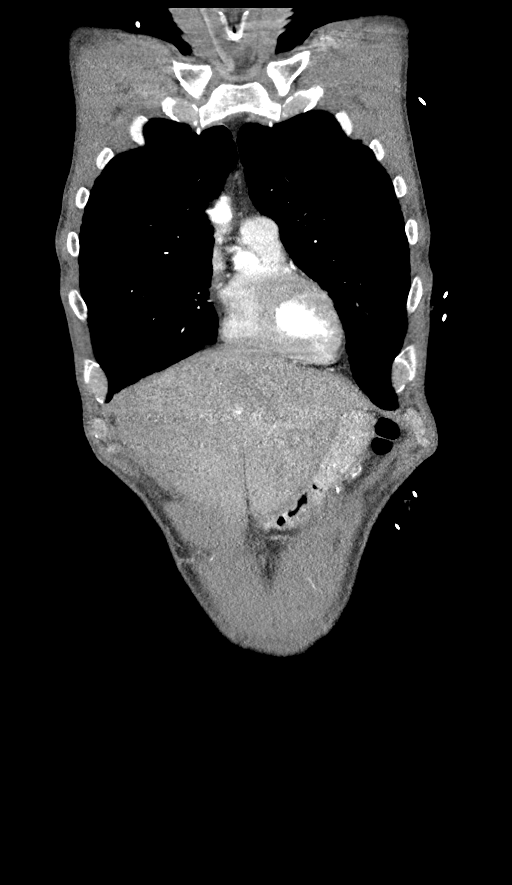
[im 74/148  soft-tissue]
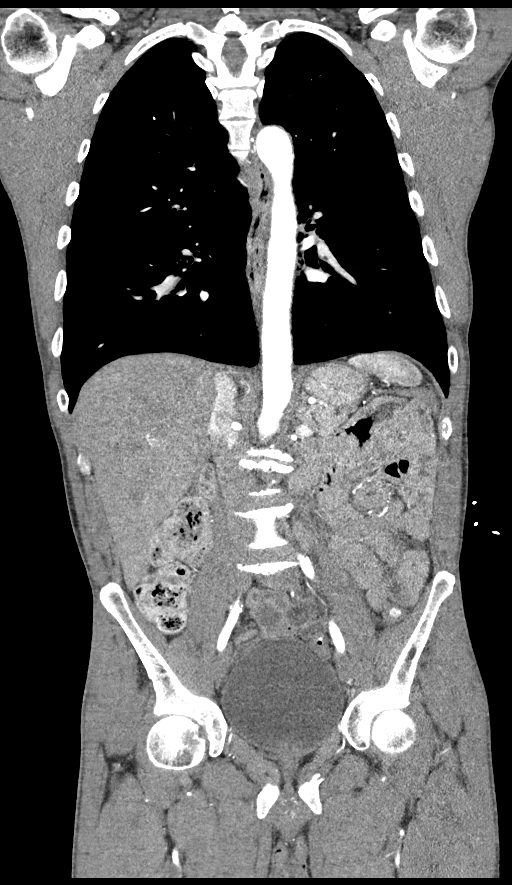
[im 111/148  soft-tissue]
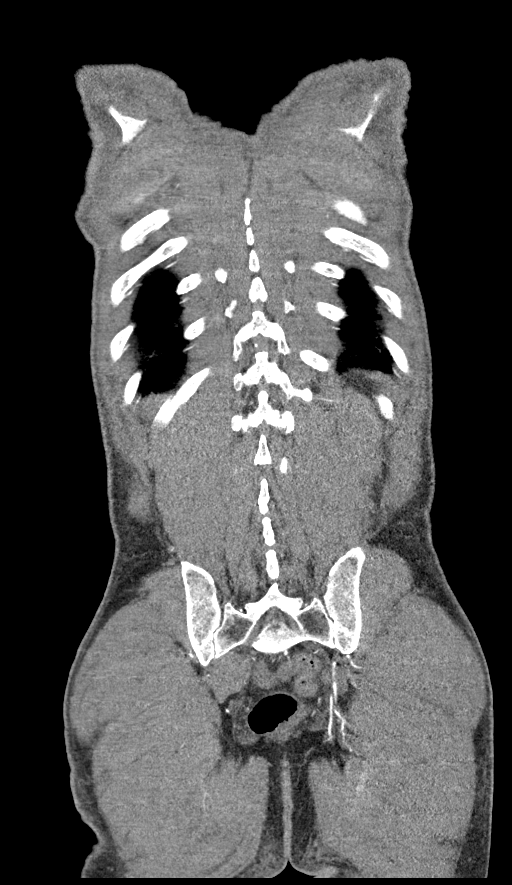

[14 of 46 positions shown; findings below may reference images not displayed]

Multidetector CT imaging through the chest, abdomen and pelvis was
performed using the standard protocol during bolus administration of
intravenous contrast. Multiplanar reconstructed images and MIPs were
obtained and reviewed to evaluate the vascular anatomy.

RADIATION DOSE REDUCTION: This exam was performed according to the
departmental dose-optimization program which includes automated
exposure control, adjustment of the mA and/or kV according to
patient size and/or use of iterative reconstruction technique.

CONTRAST:  80mL OMNIPAQUE IOHEXOL 350 MG/ML SOLN
FINDINGS: CTA CHEST FINDINGS

Cardiovascular: No intramural hematoma on noncontrast imaging.
Aortic atherosclerosis. No dissection. Normal heart size, without
pericardial effusion. Lad and left circumflex coronary artery
calcification. No central pulmonary embolism, on this non-dedicated
study.

Mediastinum/Nodes: No mediastinal or hilar adenopathy.

Lungs/Pleura: No pleural fluid. Moderate centrilobular emphysema.
Secretions in the right mainstem bronchus.

Anterior right upper lobe scarring with mild bronchiectasis.

Musculoskeletal: No acute osseous abnormality.

Review of the MIP images confirms the above findings.

CTA ABDOMEN AND PELVIS FINDINGS

Hepatobiliary: Normal liver. Normal gallbladder, without biliary
ductal dilatation.

Pancreas: Normal, without mass or ductal dilatation.

Spleen: Normal in size, without focal abnormality.

Adrenals/Urinary Tract: Normal adrenal glands. Interpolar left renal
too small to characterize lesion. Normal right kidney. No
hydronephrosis. Normal urinary bladder.

Stomach/Bowel: The proximal stomach is underdistended. Apparent wall
thickening including at 19 mm on 105/5 is most likely secondary.

Colonic stool burden suggests constipation. Normal terminal ileum
and appendix. Normal small bowel.

Vascular/Lymphatic: Aortic atherosclerosis. No aneurysm or
dissection.

Celiac and SMA both widely patent.

Single renal arteries without significant stenosis. The IMA is
diminutive, but patent.

No aneurysm or significant stenosis involving the iliac vasculature.

No abdominopelvic adenopathy.

Reproductive: Normal prostate.

Other: No significant free fluid.  No free intraperitoneal air.

Musculoskeletal: Degenerate disc disease at L2-3. Disc bulges at
L3-4 and L4-5.

Review of the MIP images confirms the above findings.
IMPRESSION: 1. No evidence of aortic aneurysm or dissection.
2. No acute process within the chest, abdomen, or pelvis.
3. Age advanced coronary artery atherosclerosis. Recommend
assessment of coronary risk factors and consideration of medical
therapy.
4. Aortic atherosclerosis (NGAX6-D70.0) and emphysema (NGAX6-N67.6).
5.  Possible constipation.
